# Patient Record
Sex: Female | Born: 1975 | Race: White | Hispanic: No | Marital: Married | Smoking: Never smoker
Health system: Southern US, Community
[De-identification: ages and names within clinical notes are randomized; demographics above are authoritative.]

## PROBLEM LIST (undated history)

## (undated) DIAGNOSIS — E079 Disorder of thyroid, unspecified: Secondary | ICD-10-CM

---

## 2012-01-20 ENCOUNTER — Ambulatory Visit: Payer: Self-pay

## 2013-03-11 IMAGING — CR RIGHT FOOT COMPLETE - 3+ VIEW
1 series · 3 of 3 positions shown · non-contrast
Comparison: none

REASON FOR EXAM: painful
COMMENTS:

PROCEDURE:     MDR - MDR FOOT RT COMP W/OBLIQUES  - January 20, 2012 [DATE]
RESULT:     Comparison:  None

[Series 1: ap · 0.17mm/px · 3 of 3 slices shown]
[im 1/3]
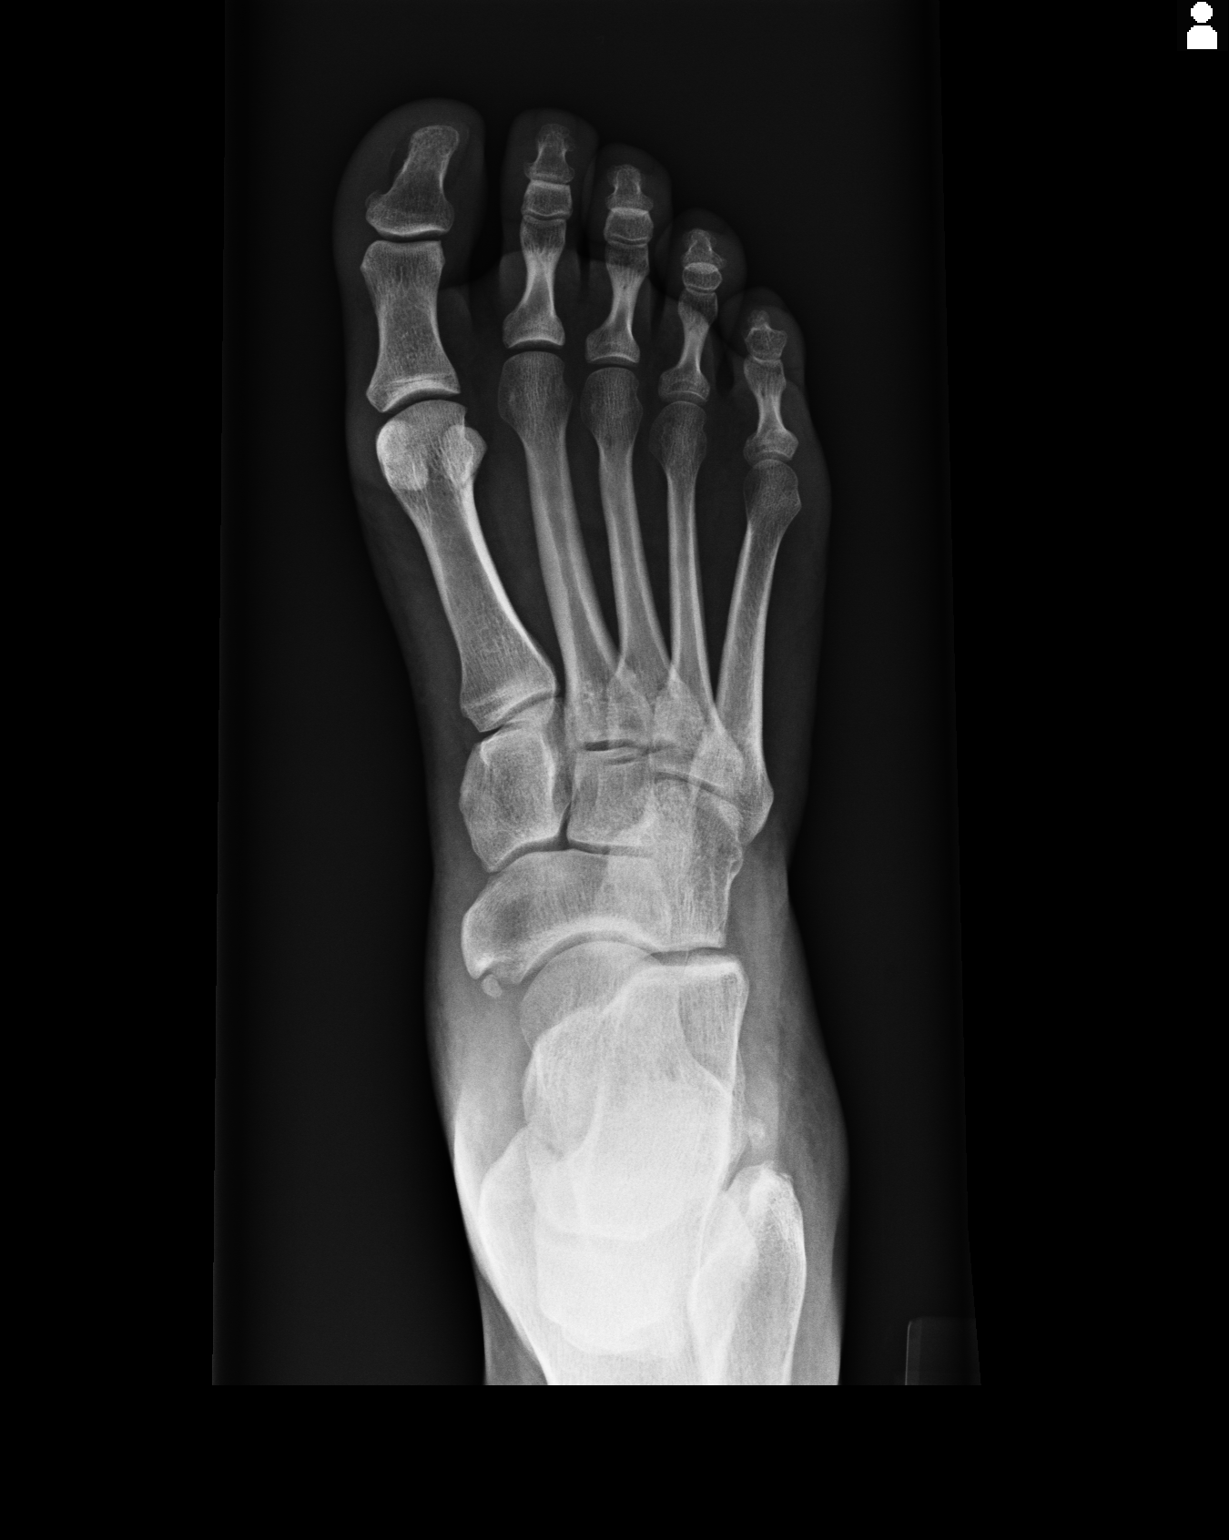
[im 2/3]
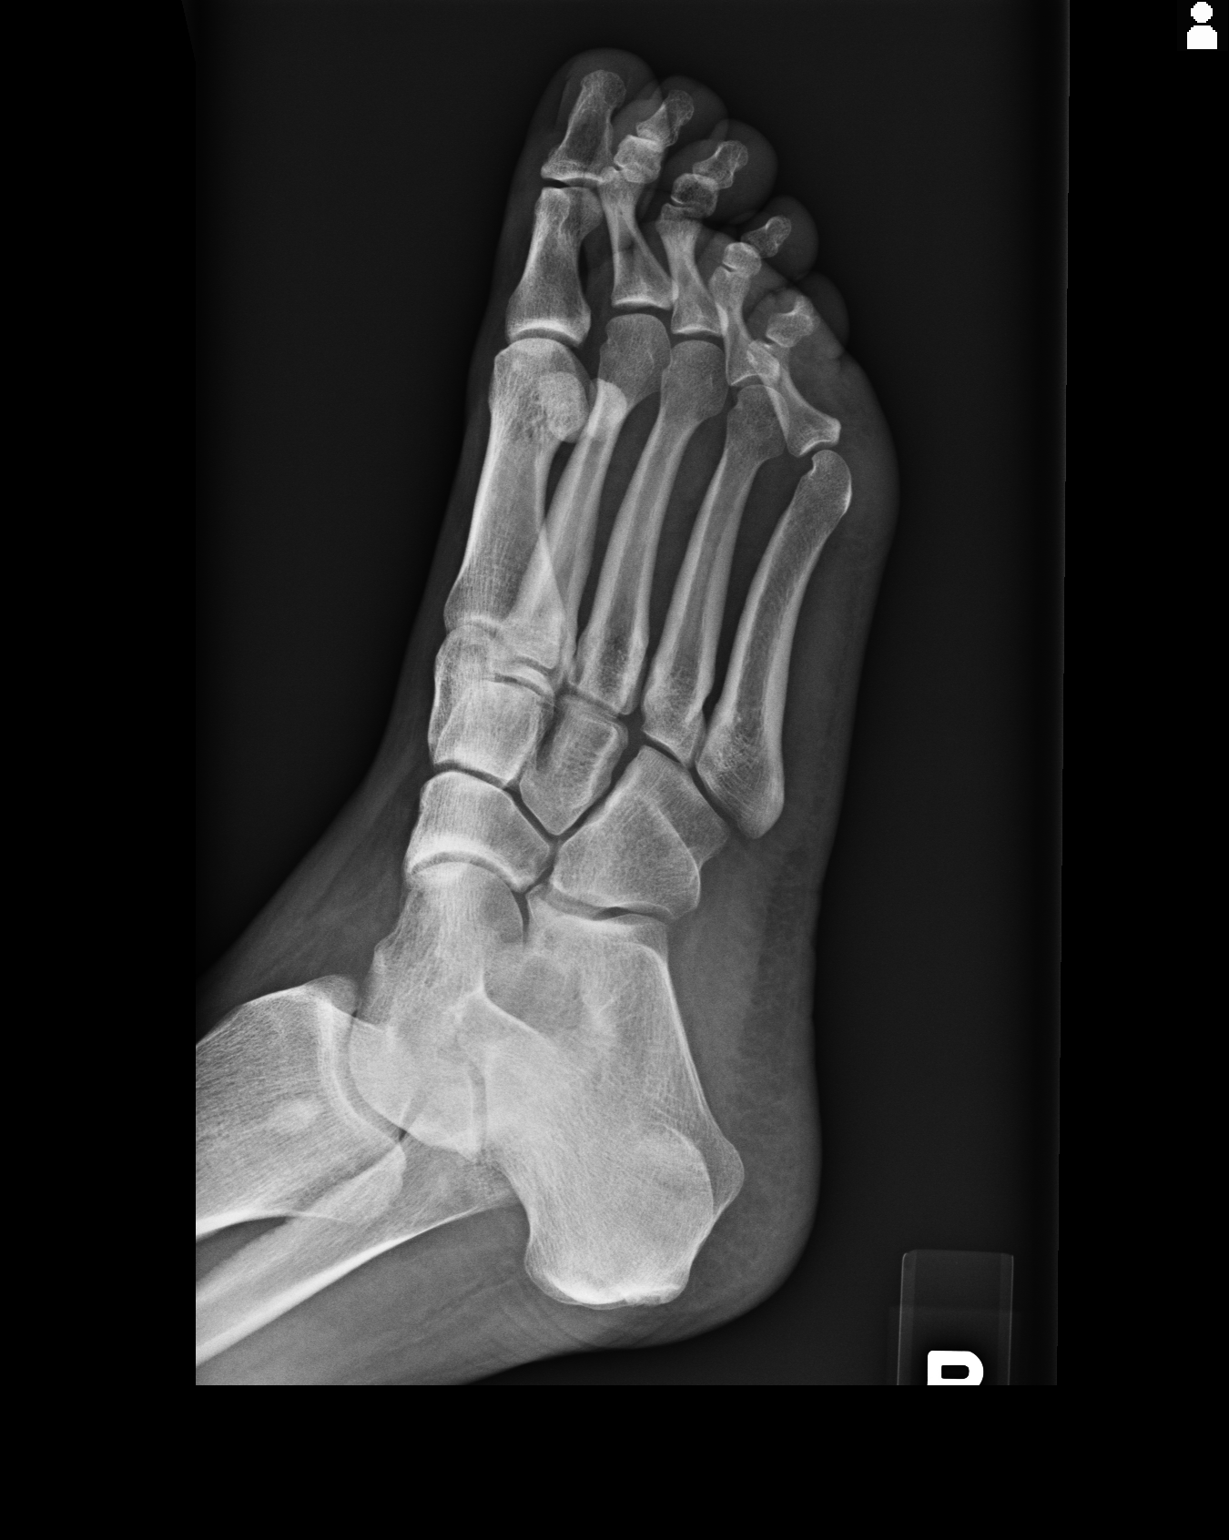
[im 3/3]
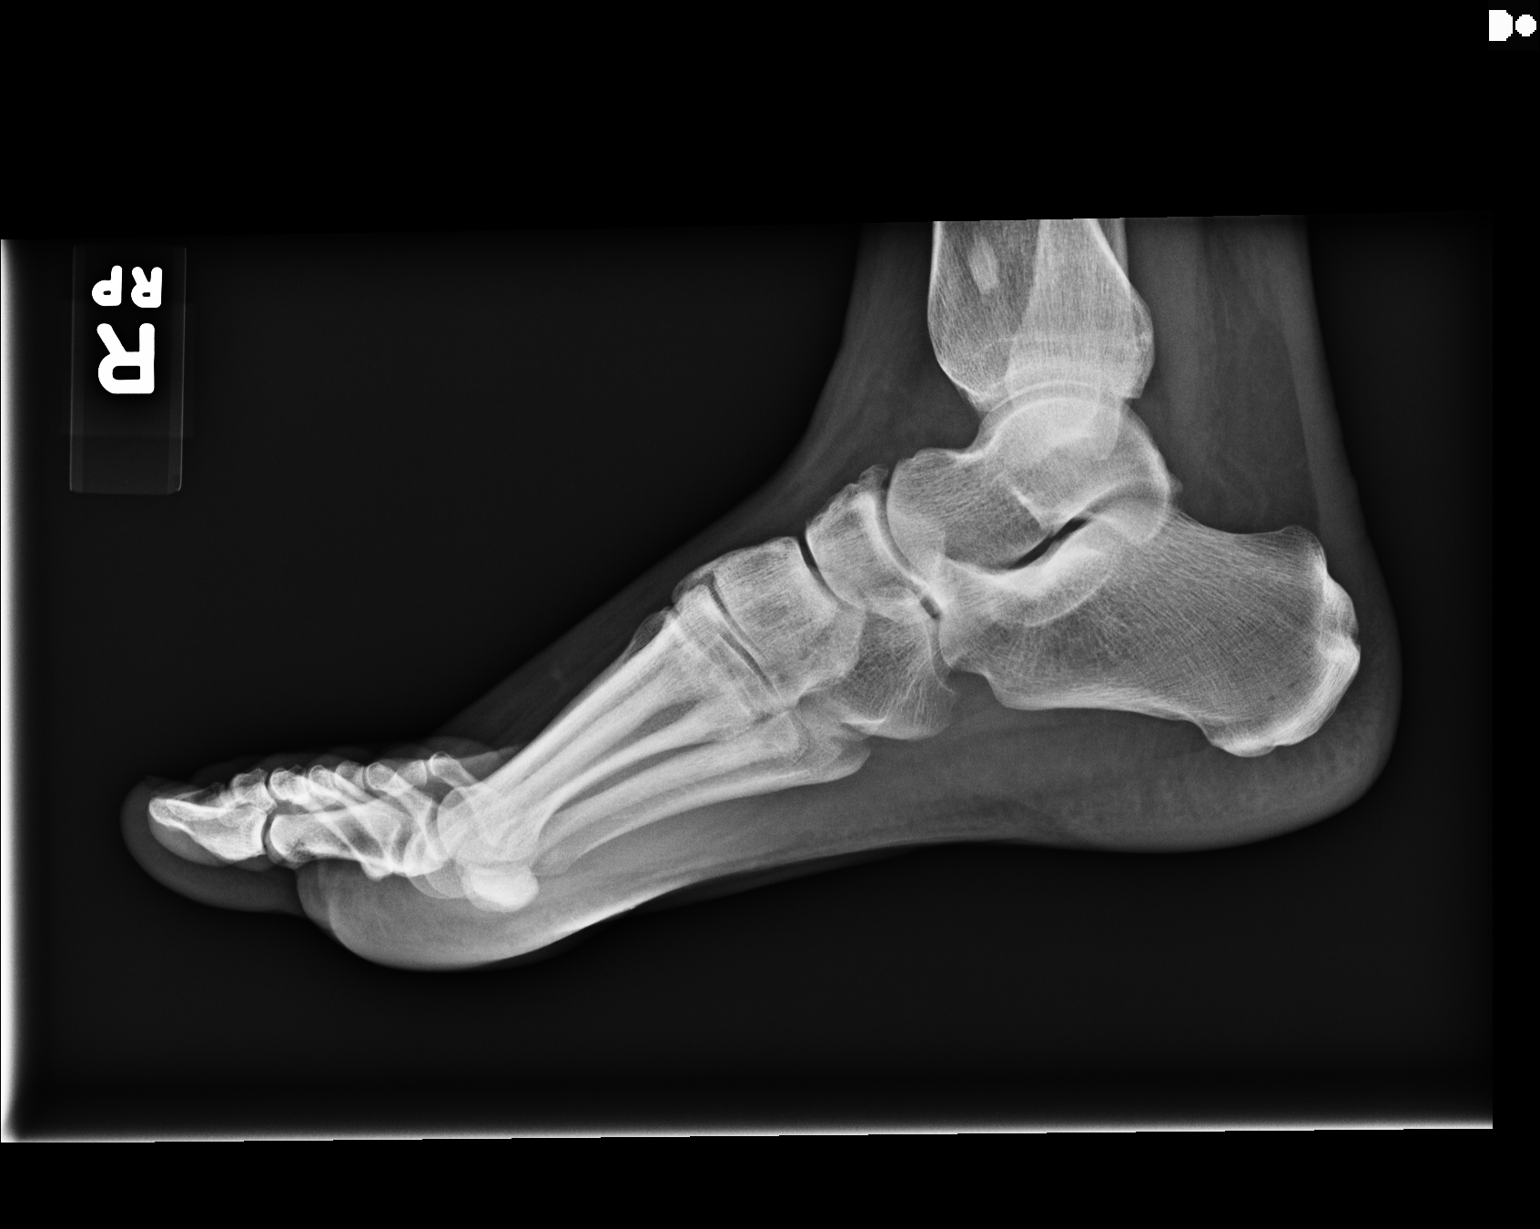

[3 of 3 positions shown; findings below may reference images not displayed]

FINDINGS: AP, oblique, and lateral views of the right foot demonstrates no fracture or
dislocation. There is no soft tissue abnormality. There is no subcutaneous
emphysema or radiopaque foreign bodies.
IMPRESSION: No acute osseous injury of the right foot.

[REDACTED]

## 2013-03-11 IMAGING — CR RIGHT ANKLE - COMPLETE 3+ VIEW
1 series · 5 of 5 positions shown · non-contrast
Comparison: none

REASON FOR EXAM: painful
COMMENTS:

PROCEDURE:     MDR - MDR ANKLE RIGHT COMPLETE  - January 20, 2012 [DATE]
RESULT:     Comparison: None

[Series 1: ap · 0.17mm/px · 5 of 5 slices shown]
[im 1/5]
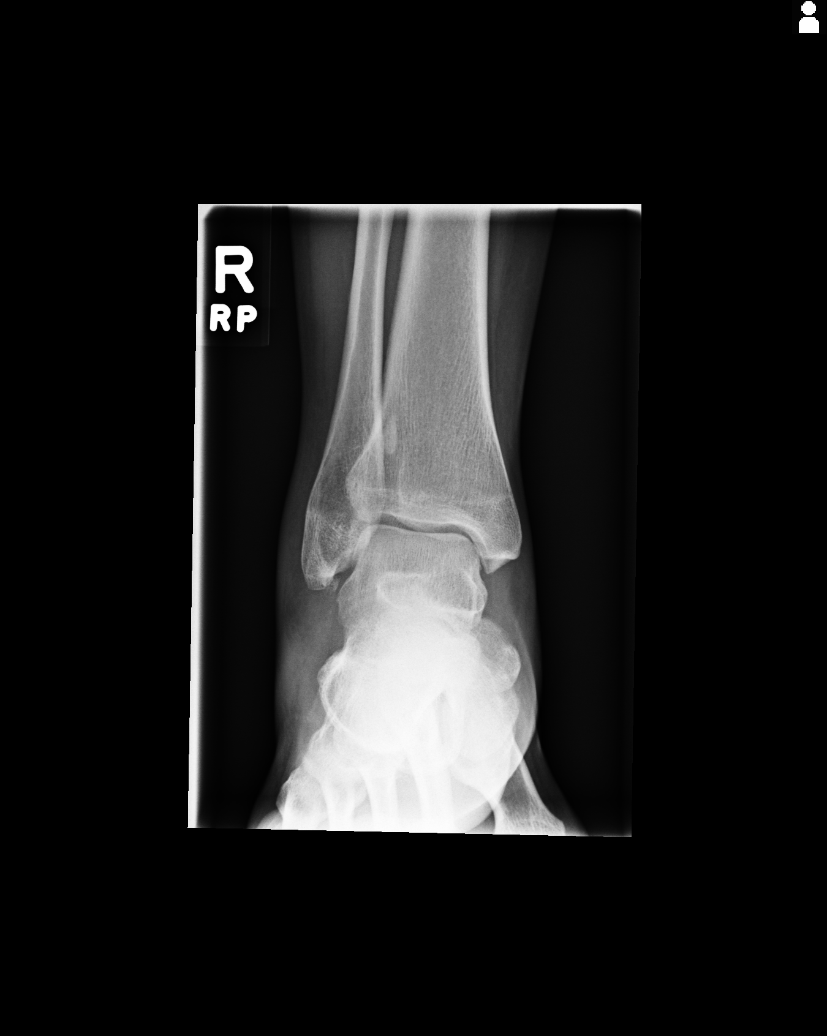
[im 2/5]
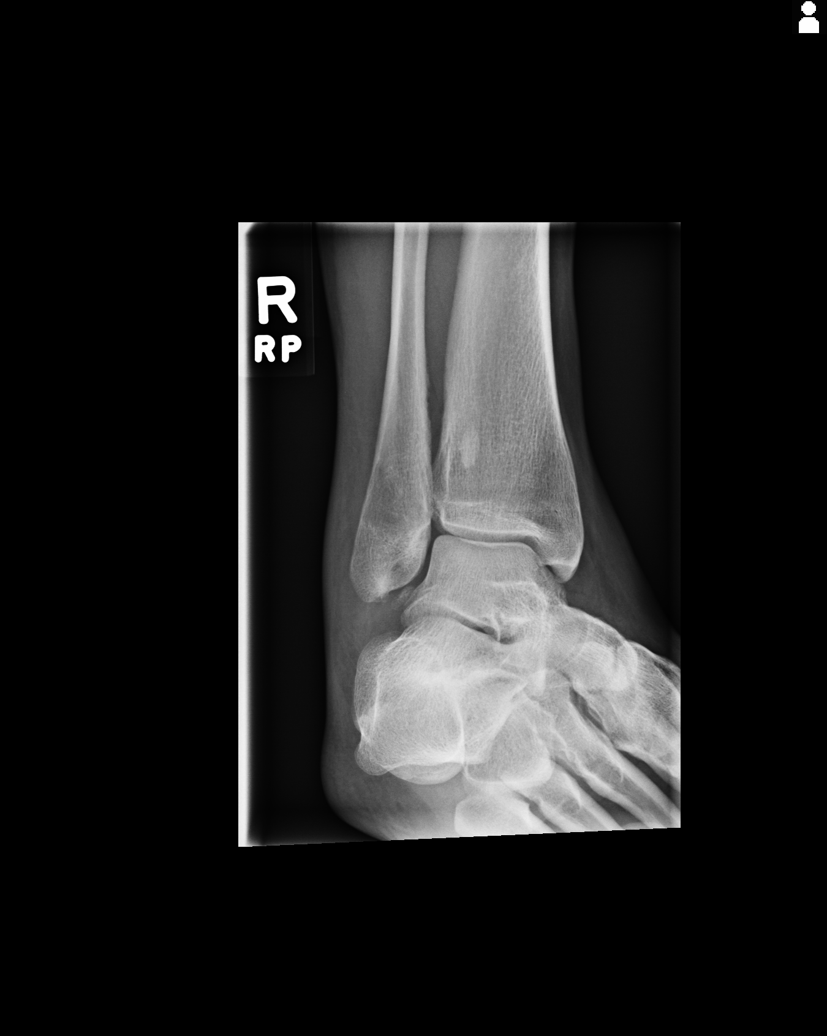
[im 3/5]
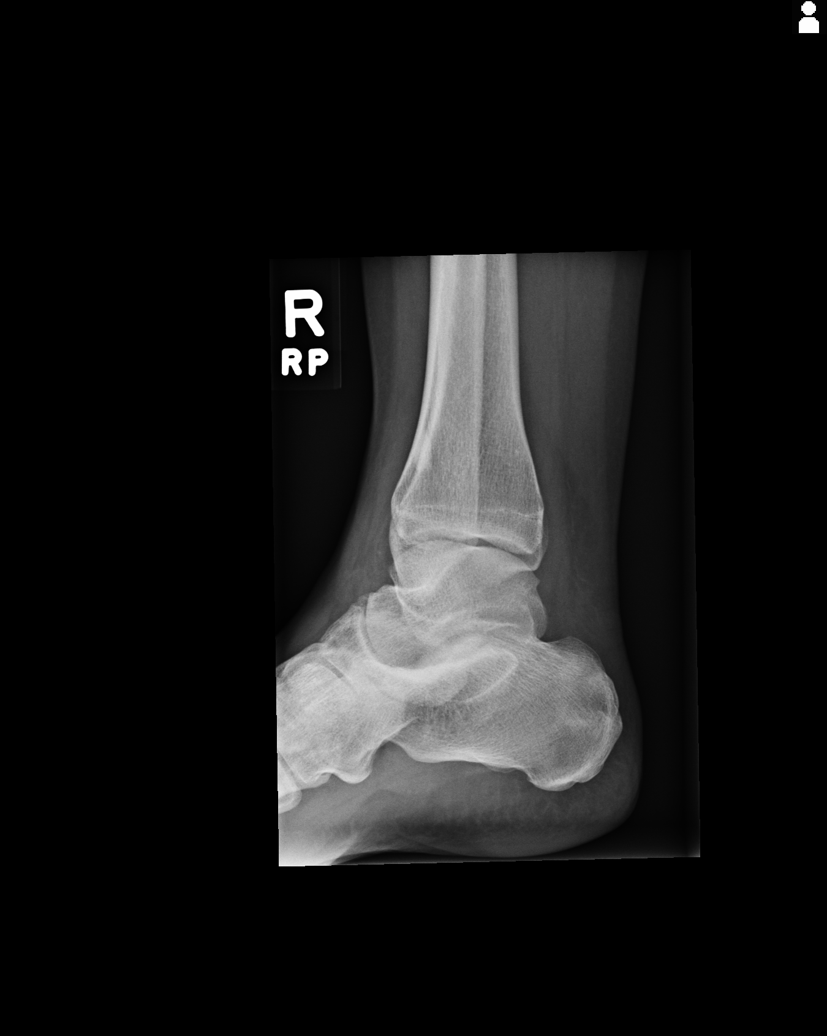
[im 4/5]
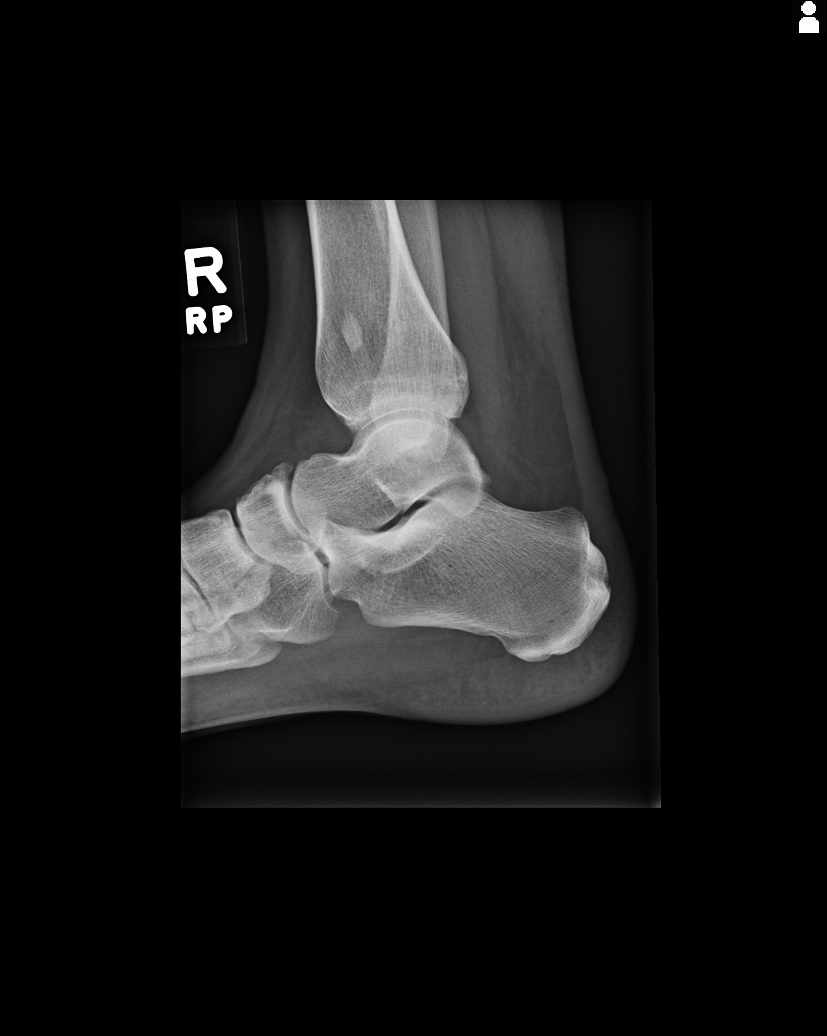
[im 5/5]
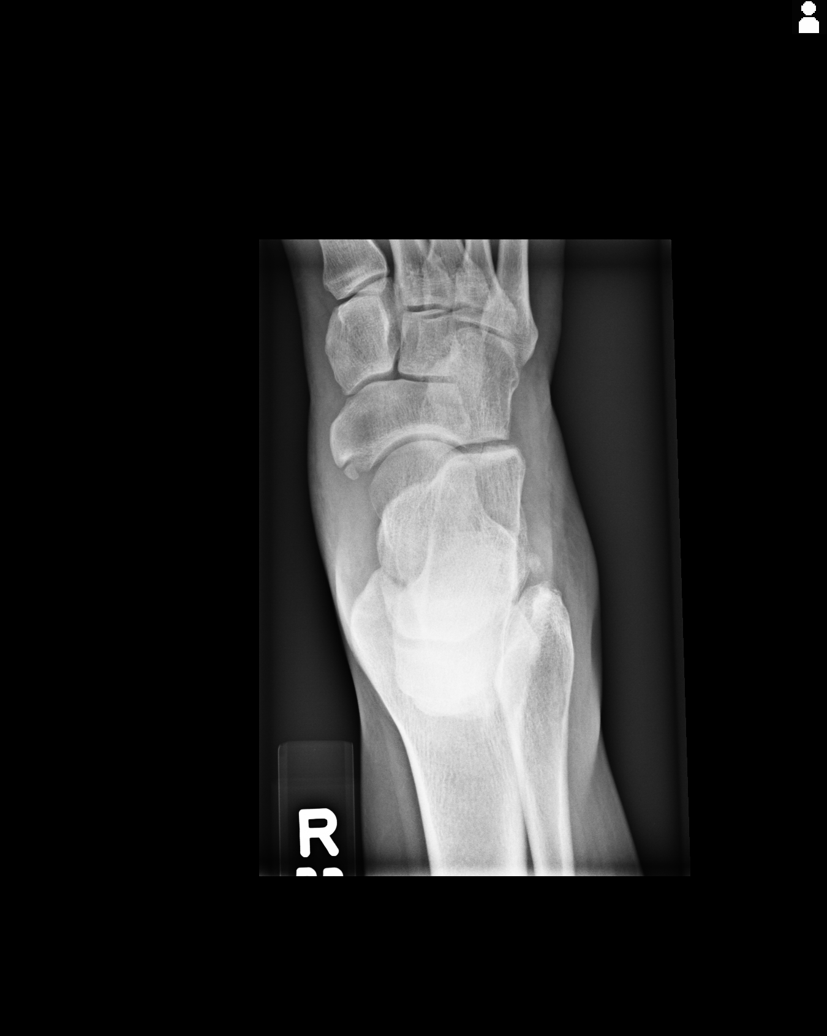

[5 of 5 positions shown; findings below may reference images not displayed]

FINDINGS: 5 views of the right ankle demonstrate no fracture or dislocation. There
ankle mortise is intact. There is no significant joint effusion. The soft
tissues are normal.
IMPRESSION: No acute osseous injury of the right ankle.

[REDACTED]

## 2013-07-22 ENCOUNTER — Ambulatory Visit: Payer: Self-pay | Admitting: Family Medicine

## 2013-07-22 LAB — RAPID STREP-A WITH REFLX: Micro Text Report: NEGATIVE

## 2013-07-24 LAB — BETA STREP CULTURE(ARMC)

## 2016-05-26 ENCOUNTER — Ambulatory Visit
Admission: EM | Admit: 2016-05-26 | Discharge: 2016-05-26 | Disposition: A | Discharge status: Home / Self Care | Payer: BLUE CROSS/BLUE SHIELD | Attending: Family Medicine | Admitting: Family Medicine

## 2016-05-26 DIAGNOSIS — J029 Acute pharyngitis, unspecified: Secondary | ICD-10-CM

## 2016-05-26 DIAGNOSIS — R6889 Other general symptoms and signs: Secondary | ICD-10-CM | POA: Diagnosis not present

## 2016-05-26 HISTORY — DX: Disorder of thyroid, unspecified: E07.9

## 2016-05-26 LAB — RAPID STREP SCREEN (MED CTR MEBANE ONLY): Streptococcus, Group A Screen (Direct): NEGATIVE

## 2016-05-26 LAB — CBC WITH DIFFERENTIAL/PLATELET
Basophils Absolute: 0 10*3/uL (ref 0–0.1)
Basophils Relative: 1 %
Eosinophils Absolute: 0.2 10*3/uL (ref 0–0.7)
Eosinophils Relative: 3 %
HCT: 39.2 % (ref 35.0–47.0)
Hemoglobin: 13.2 g/dL (ref 12.0–16.0)
Lymphocytes Relative: 25 %
Lymphs Abs: 1.5 10*3/uL (ref 1.0–3.6)
MCH: 30.1 pg (ref 26.0–34.0)
MCHC: 33.6 g/dL (ref 32.0–36.0)
MCV: 89.6 fL (ref 80.0–100.0)
Monocytes Absolute: 0.7 10*3/uL (ref 0.2–0.9)
Monocytes Relative: 13 %
Neutro Abs: 3.4 10*3/uL (ref 1.4–6.5)
Neutrophils Relative %: 58 %
Platelets: 155 10*3/uL (ref 150–440)
RBC: 4.38 MIL/uL (ref 3.80–5.20)
RDW: 14.8 % — ABNORMAL HIGH (ref 11.5–14.5)
WBC: 5.8 10*3/uL (ref 3.6–11.0)

## 2016-05-26 LAB — MONONUCLEOSIS SCREEN: Mono Screen: NEGATIVE

## 2016-05-26 MED ORDER — OSELTAMIVIR PHOSPHATE 75 MG PO CAPS: 75.0000 mg | ORAL_CAPSULE | Freq: Two times a day (BID) | ORAL | 0 refills | Status: AC

## 2016-05-26 NOTE — Discharge Instructions (Signed)
Take medication as prescribed. Rest. Drink plenty of fluids.  ° °Follow up with your primary care physician this week as needed. Return to Urgent care for new or worsening concerns.  ° °

## 2016-05-26 NOTE — ED Provider Notes (Signed)
[facility] ____________________________________________  Time seen: Approximately 4:39 PM  I have reviewed the triage vital signs and the nursing notes.   HISTORY  Chief Complaint Sore Throat   HPI Traci Cordova is a 40 y.o. female presents for complaints of 1 week of sore throat. Patient reports sore throat has been a mild to moderate sore throat for the last week. Patient at that time denied any accompanying cough, congestion or other complaints. Patient reports was able to continue to eat and drink well. Patient reports that last night she began to have a quick onset of runny nose, nasal congestion, chills and body aches. Denies known fevers. Patient reports no definite known sick contacts. Denies home contacts.  Reports is continue to eat and drink well. Denies chest pain contract breath, abdominal pain, dysuria, extremity pain, extremity swelling, rash, neck or back pain. Denies recent sickness or recent antibiotic use.  Patient's last menstrual period was 05/23/2016. Denies pregnancy.  Past Medical History:  Diagnosis Date  . Thyroid disease     There are no active problems to display for this patient.   History reviewed. No pertinent surgical history.  Current Outpatient Rx  . Order #: [id number] Class: Historical Med  . Order #: [id number] Class: Normal    No current facility-administered medications for this encounter.   Current Outpatient Prescriptions:  .  levothyroxine (SYNTHROID, LEVOTHROID) 112 MCG tablet, Take 112 mcg by mouth daily before breakfast., Disp: , Rfl:  .  oseltamivir (TAMIFLU) 75 MG capsule, Take 1 capsule (75 mg total) by mouth every 12 (twelve) hours., Disp: 10 capsule, Rfl: 0  Allergies Patient has no known allergies.  History reviewed. No pertinent family history.  Social History Social History  Substance Use Topics  . Smoking status: Never Smoker  . Smokeless tobacco: Never Used  . Alcohol use Yes     Comment: social     Review of Systems Constitutional: As above. Eyes: No visual changes. ENT: Positive sore throat. Cardiovascular: Denies chest pain. Respiratory: Denies shortness of breath. Gastrointestinal: No abdominal pain.  No nausea, no vomiting.  No diarrhea.  No constipation. Genitourinary: Negative for dysuria. Musculoskeletal: Negative for back pain. Skin: Negative for rash. Neurological: Negative for headaches, focal weakness or numbness.  10-point ROS otherwise negative.  ____________________________________________   PHYSICAL EXAM:  VITAL SIGNS: ED Triage Vitals  Enc Vitals Group     BP 05/26/16 1551 105/83     Pulse Rate 05/26/16 1551 75     Resp 05/26/16 1551 18     Temp 05/26/16 1551 98.3 F (36.8 C)     Temp Source 05/26/16 1551 Oral     SpO2 05/26/16 1551 99 %     Weight 05/26/16 1553 160 lb (72.6 kg)     Height 05/26/16 1553 5\' 7"  (1.702 m)     Head Circumference --      Peak Flow --      Pain Score 05/26/16 1555 3     Pain Loc --      Pain Edu? --      Excl. in GC? --     Constitutional: Alert and oriented. Well appearing and in no acute distress. Eyes: Conjunctivae are normal. PERRL. EOMI. Head: Atraumatic. No sinus tenderness to palpation. No swelling. No erythema.  Ears: no erythema, normal TMs bilaterally.   Nose:Nasal congestion with clear rhinorrhea  Mouth/Throat: Mucous membranes are moist.  Mild to moderate pharyngeal erythema. No tonsillar swelling or exudate.  Neck: No stridor.  No cervical  spine tenderness to palpation. Hematological/Lymphatic/Immunilogical: Mild anterior bilateral cervical lymphadenopathy. Cardiovascular: Normal rate, regular rhythm. Grossly normal heart sounds.  Good peripheral circulation. Respiratory: Normal respiratory effort.  No retractions. Lungs CTAB.No wheezes, rales or rhonchi. Good air movement.  Gastrointestinal: Soft and nontender. Normal Bowel sounds. No CVA tenderness. No hepatosplenomegaly palpated. Musculoskeletal:  Ambulatory with study gait. No cervical, thoracic or lumbar tenderness to palpation. Neurologic:  Normal speech and language. No gait instability. Skin:  Skin is warm, dry and intact. No rash noted. Psychiatric: Mood and affect are normal. Speech and behavior are normal. ___________________________________________   LABS (all labs ordered are listed, but only abnormal results are displayed)  Labs Reviewed  CBC WITH DIFFERENTIAL/PLATELET - Abnormal; Notable for the following:       Result Value   RDW 14.8 (*)    All other components within normal limits  RAPID STREP SCREEN (NOT AT [facility])  CULTURE, GROUP A STREP [facility])  MONONUCLEOSIS SCREEN    PROCEDURES Procedures    INITIAL IMPRESSION / ASSESSMENT AND PLAN / ED COURSE  Pertinent labs & imaging results that were available during my care of the patient were reviewed by me and considered in my medical decision making (see chart for details).  Well-appearing patient. No acute distress. Presents for the complaints of 1 week of sore throat. Mild to moderate pharyngeal erythema. Quick strep negative, will culture. Discussed evaluation of mono, as symptoms have continued this week. Patient agreed. CBC and mono reviewed. Mono negative. Suspect viral pharyngitis. Patient also reported quick change of symptoms in the last 24 hours leading to runny nose, nasal congestion, chills, cough and addition to her previous sore throat. Suspect influenza-like illness, in addition to viral pharyngitis. Discussed in detail with patient swallowing for flu versus treatment. Patient declined swab for flu and requests treatment with oral Tamiflu, rx given. Encouraged rest, fluids and supportive care. Discussed strict follow-up and return parameters including continuation of sore throat.Discussed indication, risks and benefits of medications with patient.  Discussed follow up with Primary care physician this week. Discussed follow up and return parameters including  no resolution or any worsening concerns. Patient verbalized understanding and agreed to plan.   Patient wanted to leave urgent care prior to result of mono, patient called and negative result was given via telephone.   ____________________________________________   FINAL CLINICAL IMPRESSION(S) / ED DIAGNOSES  Final diagnoses:  Pharyngitis, unspecified etiology  Flu-like symptoms     Discharge Medication List as of 05/26/2016  5:02 PM    START taking these medications   Details  oseltamivir (TAMIFLU) 75 MG capsule Take 1 capsule (75 mg total) by mouth every 12 (twelve) hours., Starting Sat 05/26/2016, Normal        Note: This dictation was prepared with Dragon dictation along with smaller phrase technology. Any transcriptional errors that result from this process are unintentional.    Clinical Course       Renford DillsLindsey Sona Nations, NP 05/26/16 1818

## 2016-05-26 NOTE — ED Triage Notes (Signed)
Pt with one week of sore throat. Today with severe congestion in her head and head pressure. Has mild non-productive cough. Pain 3/10. Denies fevers.

## 2016-05-29 LAB — CULTURE, GROUP A STREP (THRC)

## 2020-08-29 ENCOUNTER — Ambulatory Visit
Admission: EM | Admit: 2020-08-29 | Discharge: 2020-08-29 | Disposition: A | Discharge status: Home / Self Care | Payer: BC Managed Care – PPO | Attending: Physician Assistant | Admitting: Physician Assistant

## 2020-08-29 ENCOUNTER — Other Ambulatory Visit: Payer: Self-pay

## 2020-08-29 ENCOUNTER — Encounter: Payer: Self-pay | Admitting: Emergency Medicine

## 2020-08-29 DIAGNOSIS — W5503XA Scratched by cat, initial encounter: Secondary | ICD-10-CM

## 2020-08-29 DIAGNOSIS — L03011 Cellulitis of right finger: Secondary | ICD-10-CM

## 2020-08-29 MED ORDER — AZITHROMYCIN 250 MG PO TABS: ORAL_TABLET | ORAL | 0 refills | Status: AC

## 2020-08-29 NOTE — ED Provider Notes (Signed)
[facility]    CSN: [id number] Arrival date & time: 08/29/20  0801      History   Chief Complaint Chief Complaint  Patient presents with  . cat scratch    HPI Traci Cordova is a 45 y.o. female presenting for redness, swelling and pain of the distal right index finger since earlier this morning.  Patient states that 5 days ago her kitten scratched her finger.  She says that the finger was fine and seem to be healing and then about 6 to 7 hours ago she noticed the redness and swelling started.  Denies any fever, red streaking up the hand or arm, pustular drainage, or lymphadenopathy.  Has not taken any medication for symptoms.  Denies any history of recurrent skin infections or MRSA.  Patient is going out of town on a cruise in a week and stated that she wanted to make sure she had her finger checked out and it was better before she left for a cruise.  She has no other concerns.  HPI  Past Medical History:  Diagnosis Date  . Thyroid disease     There are no problems to display for this patient.   History reviewed. No pertinent surgical history.  OB History   No obstetric history on file.      Home Medications    Prior to Admission medications   Medication Sig Start Date End Date Taking? Authorizing Provider  azithromycin (ZITHROMAX) 250 MG tablet Take first 2 tablets PO together on day 1, then 1 tab every day until finished. 08/29/20  Yes Shirlee Latch, PA-C  levothyroxine (SYNTHROID, LEVOTHROID) 112 MCG tablet Take 112 mcg by mouth daily before breakfast.   Yes [provider]  oseltamivir (TAMIFLU) 75 MG capsule Take 1 capsule (75 mg total) by mouth every 12 (twelve) hours. 05/26/16   Renford Dills, NP    Family History History reviewed. No pertinent family history.  Social History Social History   Tobacco Use  . Smoking status: Never Smoker  . Smokeless tobacco: Never Used  Substance Use Topics  . Alcohol use: Yes    Comment: social   . Drug use: No     Allergies   Patient has no known allergies.   Review of Systems Review of Systems  Constitutional: Negative for chills, diaphoresis, fatigue and fever.  Respiratory: Negative for cough.   Gastrointestinal: Negative for nausea and vomiting.  Musculoskeletal: Negative for arthralgias and myalgias.  Skin: Positive for color change and wound. Negative for rash.  Neurological: Negative for weakness, numbness and headaches.  Hematological: Negative for adenopathy.     Physical Exam Triage Vital Signs ED Triage Vitals  Enc Vitals Group     BP 08/29/20 0819 118/89     Pulse Rate 08/29/20 0819 68     Resp 08/29/20 0819 18     Temp 08/29/20 0819 98.6 F (37 C)     Temp Source 08/29/20 0819 Oral     SpO2 08/29/20 0819 100 %     Weight 08/29/20 0817 160 lb 0.9 oz (72.6 kg)     Height 08/29/20 0817 5\' 7"  (1.702 m)     Head Circumference --      Peak Flow --      Pain Score 08/29/20 0817 4     Pain Loc --      Pain Edu? --      Excl. in GC? --    No data found.  Updated Vital Signs  BP 118/89 (BP Location: Right Arm)   Pulse 68   Temp 98.6 F (37 C) (Oral)   Resp 18   Ht 5\' 7"  (1.702 m)   Wt 160 lb 0.9 oz (72.6 kg)   LMP 08/01/2020 (Approximate)   SpO2 100%   BMI 25.07 kg/m        Physical Exam Vitals and nursing note reviewed.  Constitutional:      General: She is not in acute distress.    Appearance: Normal appearance. She is not ill-appearing or toxic-appearing.  HENT:     Head: Normocephalic and atraumatic.  Eyes:     General: No scleral icterus.       Right eye: No discharge.        Left eye: No discharge.     Conjunctiva/sclera: Conjunctivae normal.  Cardiovascular:     Rate and Rhythm: Normal rate and regular rhythm.     Pulses: Normal pulses.  Pulmonary:     Effort: Pulmonary effort is normal. No respiratory distress.  Musculoskeletal:     Cervical back: Neck supple.  Skin:    General: Skin is dry.     Comments: RIGHT INDEX  FINGER: See photo below. There is erythema,warmth, swelling, and tenderness of the distal index finger pad. No streaking. Healing small laceration. No drainage.  Neurological:     General: No focal deficit present.     Mental Status: She is alert. Mental status is at baseline.     Motor: No weakness.     Gait: Gait normal.  Psychiatric:        Mood and Affect: Mood normal.        Behavior: Behavior normal.        Thought Content: Thought content normal.        UC Treatments / Results  Labs (all labs ordered are listed, but only abnormal results are displayed) Labs Reviewed - No data to display  EKG   Radiology No results found.  Procedures Procedures (including critical care time)  Medications Ordered in UC Medications - No data to display  Initial Impression / Assessment and Plan / UC Course  I have reviewed the triage vital signs and the nursing notes.  Pertinent labs & imaging results that were available during my care of the patient were reviewed by me and considered in my medical decision making (see chart for details).    45 year old female presenting for scratch of right index finger from 5 days ago with subsequent erythema, swelling and pain started today.  Exam consistent with secondary bacterial infection.  Covering patient for Bartonella species with azithromycin.  Advised her to monitor this area closely and if not looking better in 2 days or if any worsening of symptoms then she should call or return and let 55 change her antibiotic.  Advised supportive care.  ED precautions reviewed patient.  Final Clinical Impressions(s) / UC Diagnoses   Final diagnoses:  Cat scratch  Cellulitis of finger of right hand     Discharge Instructions     Start antibiotics.  Your wound should be starting to improve within 1 to 2 days.  It should not have any spreading redness want to start antibiotics.  If that happens please call our office and we will send a different  antibiotic for you.  If you develop a fever, red streaks up your hand or arm, or enlarged lymph nodes, please call or return.  It is ideal to have someone take another look at the  finger within 2 days, especially if it is not improving.  If you cannot see your PCP, please return to our clinic.  Go to ED for any severe acute worsening of symptoms.    ED Prescriptions    Medication Sig Dispense Auth. Provider   azithromycin (ZITHROMAX) 250 MG tablet Take first 2 tablets PO together on day 1, then 1 tab every day until finished. 6 tablet Gareth Morgan     PDMP not reviewed this encounter.   Shirlee Latch, PA-C 08/29/20 [phone]

## 2020-08-29 NOTE — Discharge Instructions (Signed)
Start antibiotics.  Your wound should be starting to improve within 1 to 2 days.  It should not have any spreading redness want to start antibiotics.  If that happens please call our office and we will send a different antibiotic for you.  If you develop a fever, red streaks up your hand or arm, or enlarged lymph nodes, please call or return.  It is ideal to have someone take another look at the finger within 2 days, especially if it is not improving.  If you cannot see your PCP, please return to our clinic.  Go to ED for any severe acute worsening of symptoms.

## 2020-08-29 NOTE — ED Triage Notes (Signed)
Pt states her cat scratched her about 5 days ago. She has pain, swelling and redness on her right index finger.

## 2020-08-31 ENCOUNTER — Telehealth: Payer: Self-pay | Admitting: Emergency Medicine

## 2020-08-31 MED ORDER — DOXYCYCLINE HYCLATE 100 MG PO CAPS: 100.0000 mg | ORAL_CAPSULE | Freq: Two times a day (BID) | ORAL | 0 refills | Status: AC

## 2020-08-31 NOTE — Telephone Encounter (Signed)
Patient states that she has been taking her azithromycin but is not seeing any improvement and she is concerned that the infection may get worse.  She is getting ready on a cruise and does not want an infection to affect that.  Will send prescription for doxycycline to take along with the azithromycin for potential cellulitis as a result of the cat scratch.
